# Patient Record
Sex: Female | Born: 1980 | Race: Black or African American | Hispanic: No | Smoking: Current every day smoker
Health system: Southern US, Community
[De-identification: ages and names within clinical notes are randomized; demographics above are authoritative.]

---

## 2016-01-09 ENCOUNTER — Emergency Department: Payer: Self-pay

## 2016-01-09 ENCOUNTER — Emergency Department
Admission: EM | Admit: 2016-01-09 | Discharge: 2016-01-09 | Disposition: A | Discharge status: Home / Self Care | Payer: Self-pay | Attending: Emergency Medicine | Admitting: Emergency Medicine

## 2016-01-09 DIAGNOSIS — R0789 Other chest pain: Secondary | ICD-10-CM | POA: Insufficient documentation

## 2016-01-09 DIAGNOSIS — R6889 Other general symptoms and signs: Secondary | ICD-10-CM

## 2016-01-09 DIAGNOSIS — J029 Acute pharyngitis, unspecified: Secondary | ICD-10-CM | POA: Insufficient documentation

## 2016-01-09 DIAGNOSIS — R5383 Other fatigue: Secondary | ICD-10-CM | POA: Insufficient documentation

## 2016-01-09 DIAGNOSIS — G43809 Other migraine, not intractable, without status migrainosus: Secondary | ICD-10-CM | POA: Insufficient documentation

## 2016-01-09 DIAGNOSIS — F17200 Nicotine dependence, unspecified, uncomplicated: Secondary | ICD-10-CM | POA: Insufficient documentation

## 2016-01-09 LAB — URINALYSIS COMPLETE WITH MICROSCOPIC (ARMC ONLY)
BACTERIA UA: NONE SEEN
Bilirubin Urine: NEGATIVE
GLUCOSE, UA: NEGATIVE mg/dL
Ketones, ur: NEGATIVE mg/dL
Leukocytes, UA: NEGATIVE
Nitrite: NEGATIVE
PROTEIN: NEGATIVE mg/dL
Specific Gravity, Urine: 1.016 (ref 1.005–1.030)
pH: 6 (ref 5.0–8.0)

## 2016-01-09 LAB — CBC
HEMATOCRIT: 35.7 % (ref 35.0–47.0)
HEMOGLOBIN: 12.8 g/dL (ref 12.0–16.0)
MCH: 33.8 pg (ref 26.0–34.0)
MCHC: 35.8 g/dL (ref 32.0–36.0)
MCV: 94.5 fL (ref 80.0–100.0)
Platelets: 351 10*3/uL (ref 150–440)
RBC: 3.78 MIL/uL — ABNORMAL LOW (ref 3.80–5.20)
RDW: 13.1 % (ref 11.5–14.5)
WBC: 7.1 10*3/uL (ref 3.6–11.0)

## 2016-01-09 LAB — BASIC METABOLIC PANEL
ANION GAP: 7 (ref 5–15)
BUN: 12 mg/dL (ref 6–20)
CO2: 23 mmol/L (ref 22–32)
Calcium: 8.9 mg/dL (ref 8.9–10.3)
Chloride: 106 mmol/L (ref 101–111)
Creatinine, Ser: 0.79 mg/dL (ref 0.44–1.00)
GFR calc Af Amer: >60 mL/min (ref >60)
GLUCOSE: 106 mg/dL — AB (ref 65–99)
POTASSIUM: 4 mmol/L (ref 3.5–5.1)
Sodium: 136 mmol/L (ref 135–145)

## 2016-01-09 LAB — POCT PREGNANCY, URINE: PREG TEST UR: NEGATIVE

## 2016-01-09 LAB — TROPONIN I: Troponin I: <0.03 ng/mL (ref <0.03)

## 2016-01-09 MED ORDER — KETOROLAC TROMETHAMINE 30 MG/ML IJ SOLN
30.0000 mg | Freq: Once | INTRAMUSCULAR | Status: AC
  Administered 2016-01-09: 30 mg via INTRAVENOUS
  Filled 2016-01-09: qty 1

## 2016-01-09 MED ORDER — SODIUM CHLORIDE 0.9 % IV BOLUS (SEPSIS)
1000.0000 mL | Freq: Once | INTRAVENOUS | Status: AC
  Administered 2016-01-09: 1000 mL via INTRAVENOUS

## 2016-01-09 MED ORDER — METOCLOPRAMIDE HCL 5 MG/ML IJ SOLN
10.0000 mg | Freq: Once | INTRAMUSCULAR | Status: AC
  Administered 2016-01-09: 10 mg via INTRAVENOUS
  Filled 2016-01-09: qty 2

## 2016-01-09 NOTE — Discharge Instructions (Signed)
Try to rest for the next 48 hours. Take ibuprofen 600 mg every 6 hours for body aches and headache. Drink plenty of fluids. Follow-up with her doctor on Monday for not feeling better. Return to the emergency room if you have a fever, abdominal pain, difficulty breathing, or any other symptoms that are concerning to you.

## 2016-01-09 NOTE — ED Provider Notes (Signed)
[facility] Emergency Department Provider Note  ____________________________________________  Time seen: Approximately 1:40 PM  I have reviewed the triage vital signs and the nursing notes.   HISTORY  Chief Complaint Chest Pain (chest tightness); Generalized Body Aches; and Migraine (headache)   HPI Leslie Holloway is a 35 y.o. female no significant past medical history who presents for evaluation of headache, body aches, chills, and sore throat. Patient reports that her symptoms have been going on for 2 days. She also reports a dry cough. Patient has a history of migraines and reports that this feels like one of her migraines, it is throbbing, diffuse, 8/10, associated with photophobia. She denies fever, tick bites, rash, neck stiffness. She reports that she had a sore throat yesterday however that getting better. She also reports body aches and chills, fatigue. She reports nausea and a few episodes of nonbloody nonbilious emesis. Today she reports that she started having sharp intermittent chest pain. She reports that the chest pain is located in the left side of her chest, sharp, lasting a few minutes at a time, associated with numbness of her both arms. She has had a few episodes throughout the day today. She denies shortness of breath, lightheadedness or syncope. No known sick contacts. Also reports dry cough.   History reviewed. No pertinent past medical history.  There are no active problems to display for this patient.   History reviewed. No pertinent surgical history.  Prior to Admission medications   Not on File    Allergies Darvon [propoxyphene]  No family history on file.  Social History Social History  Substance Use Topics  . Smoking status: Current Every Day Smoker  . Smokeless tobacco: Not on file  . Alcohol use Yes    Review of Systems  Constitutional: Negative for fever. + chills and fatigue, body aches Eyes: Negative for visual  changes. ENT: + sore throat. Cardiovascular: + chest pain. Respiratory: Negative for shortness of breath. + cough Gastrointestinal: Negative for abdominal pain, vomiting or diarrhea. Genitourinary: Negative for dysuria. Musculoskeletal: Negative for back pain. Skin: Negative for rash. Neurological: Negative for weakness or numbness. + HA  ____________________________________________   PHYSICAL EXAM:  VITAL SIGNS: ED Triage Vitals [01/09/16 1248]  Enc Vitals Group     BP (!) 158/83     Pulse Rate 75     Resp 18     Temp 98.2 F (36.8 C)     Temp Source Oral     SpO2 98 %     Weight 178 lb (80.7 kg)     Height 5\' 8"  (1.727 m)     Head Circumference      Peak Flow      Pain Score 10     Pain Loc      Pain Edu?      Excl. in GC?     Constitutional: Alert and oriented. Well appearing and in no apparent distress. HEENT:      Head: Normocephalic and atraumatic.         Eyes: Conjunctivae are normal. Sclera is non-icteric. EOMI. PERRL      Mouth/Throat: Mucous membranes are moist. Oropharynx is clear with no erythema, no exudate, no evidence of peritonsillar abscess      Neck: Supple with no signs of meningismus. No cervical lymphadenopathy Cardiovascular: Regular rate and rhythm. No murmurs, gallops, or rubs. 2+ symmetrical distal pulses are present in all extremities. No JVD. Respiratory: Normal respiratory effort. Lungs are clear to auscultation bilaterally.  No wheezes, crackles, or rhonchi.  Gastrointestinal: Soft, non tender, and non distended with positive bowel sounds. No rebound or guarding. Genitourinary: No CVA tenderness. Musculoskeletal: Nontender with normal range of motion in all extremities. No edema, cyanosis, or erythema of extremities. Neurologic: Normal speech and language. Face is symmetric. Moving all extremities. No gross focal neurologic deficits are appreciated. Skin: Skin is warm, dry and intact. No rash noted. Psychiatric: Mood and affect are normal.  Speech and behavior are normal.  ____________________________________________   LABS (all labs ordered are listed, but only abnormal results are displayed)  Labs Reviewed  BASIC METABOLIC PANEL - Abnormal; Notable for the following:       Result Value   Glucose, Bld 106 (*)    All other components within normal limits  CBC - Abnormal; Notable for the following:    RBC 3.78 (*)    All other components within normal limits  URINALYSIS COMPLETEWITH MICROSCOPIC ([facility] ONLY) - Abnormal; Notable for the following:    Color, Urine YELLOW (*)    APPearance HAZY (*)    Hgb urine dipstick 2+ (*)    Squamous Epithelial / LPF 6-30 (*)    All other components within normal limits  TROPONIN I  POCT PREGNANCY, URINE   ____________________________________________  EKG  ED ECG REPORT I, Nita Sickle, the attending physician, personally viewed and interpreted this ECG. Normal sinus rhythm, rate of 62, normal intervals, normal axis, no ST elevations or depressions  ____________________________________________  RADIOLOGY  CXR: negative  ____________________________________________   PROCEDURES  Procedure(s) performed: None Procedures Critical Care performed:  None ____________________________________________   INITIAL IMPRESSION / ASSESSMENT AND PLAN / ED COURSE  35 y.o. female no significant past medical history who presents for evaluation of headache, body aches, chills, fatigue, nausea, vomiting, and sore throat. Patient is well-appearing, in no distress, her vital signs are within normal limits, EKG is within normal limits, chest x-ray negative for pneumonia. Oropharynx is clear with no exudate, no rash, no cervical lymphadenopathy, no meningismus, neurologically intact. Presentation concerning for viral syndrome. Labs within normal limits. We'll treat symptoms with IV fluids, IV Toradol, IV Reglan and reassess.  Clinical Course  Comment By Time  Patient reports she feels  much better. HA resolved.  Her labs are all within normal limits. Chest x-ray negative for pneumonia. Presentation concerning for possible viral process. We'll discharge home with supportive care follow-up with primary care doctor. Patient and husband are comfortable with the plan. Nita Sickle, MD 08/26 1526    Pertinent labs & imaging results that were available during my care of the patient were reviewed by me and considered in my medical decision making (see chart for details).    ____________________________________________   FINAL CLINICAL IMPRESSION(S) / ED DIAGNOSES  Final diagnoses:  Other migraine without status migrainosus, not intractable  Flu-like symptoms      NEW MEDICATIONS STARTED DURING THIS VISIT:  New Prescriptions   No medications on file     Note:  This document was prepared using Dragon voice recognition software and may include unintentional dictation errors.    Nita Sickle, MD 01/09/16 [phone]

## 2016-01-09 NOTE — ED Notes (Signed)
Signature pad not working.  Pt verbalized understanding of discharge instructions and has no further questions. 

## 2016-01-09 NOTE — ED Notes (Signed)
Patient transported to X-ray 

## 2016-01-09 NOTE — ED Triage Notes (Addendum)
Pt arrives to ER via POV c/o chest tightness and headache that began this morning, body aches X 2 days. Pt tearful. Pt alert and oriented X4, active, cooperative, pt in NAD. RR even and unlabored, color WNL.

## 2016-02-15 ENCOUNTER — Encounter: Payer: Self-pay | Admitting: Emergency Medicine

## 2016-02-15 ENCOUNTER — Emergency Department: Payer: Self-pay

## 2016-02-15 ENCOUNTER — Emergency Department
Admission: EM | Admit: 2016-02-15 | Discharge: 2016-02-15 | Disposition: A | Discharge status: Home / Self Care | Payer: Self-pay | Attending: Emergency Medicine | Admitting: Emergency Medicine

## 2016-02-15 DIAGNOSIS — L03317 Cellulitis of buttock: Secondary | ICD-10-CM

## 2016-02-15 DIAGNOSIS — Z79899 Other long term (current) drug therapy: Secondary | ICD-10-CM | POA: Insufficient documentation

## 2016-02-15 DIAGNOSIS — F17200 Nicotine dependence, unspecified, uncomplicated: Secondary | ICD-10-CM | POA: Insufficient documentation

## 2016-02-15 DIAGNOSIS — L0231 Cutaneous abscess of buttock: Secondary | ICD-10-CM | POA: Insufficient documentation

## 2016-02-15 LAB — CBC WITH DIFFERENTIAL/PLATELET
Basophils Absolute: 0.1 10*3/uL (ref 0–0.1)
Basophils Relative: 1 %
Eosinophils Absolute: 0.3 10*3/uL (ref 0–0.7)
Eosinophils Relative: 2 %
HEMATOCRIT: 36.5 % (ref 35.0–47.0)
HEMOGLOBIN: 12 g/dL (ref 12.0–16.0)
LYMPHS ABS: 2.6 10*3/uL (ref 1.0–3.6)
LYMPHS PCT: 20 %
MCH: 30.9 pg (ref 26.0–34.0)
MCHC: 32.8 g/dL (ref 32.0–36.0)
MCV: 94.3 fL (ref 80.0–100.0)
MONOS PCT: 8 %
Monocytes Absolute: 1 10*3/uL — ABNORMAL HIGH (ref 0.2–0.9)
NEUTROS PCT: 69 %
Neutro Abs: 8.7 10*3/uL — ABNORMAL HIGH (ref 1.4–6.5)
PLATELETS: 470 10*3/uL — AB (ref 150–440)
RBC: 3.87 MIL/uL (ref 3.80–5.20)
RDW: 13 % (ref 11.5–14.5)
WBC: 12.7 10*3/uL — AB (ref 3.6–11.0)

## 2016-02-15 LAB — COMPREHENSIVE METABOLIC PANEL
ALT: 15 U/L (ref 14–54)
AST: 19 U/L (ref 15–41)
Albumin: 3.7 g/dL (ref 3.5–5.0)
Alkaline Phosphatase: 53 U/L (ref 38–126)
Anion gap: 8 (ref 5–15)
BILIRUBIN TOTAL: 1.3 mg/dL — AB (ref 0.3–1.2)
BUN: 7 mg/dL (ref 6–20)
CHLORIDE: 101 mmol/L (ref 101–111)
CO2: 29 mmol/L (ref 22–32)
CREATININE: 0.79 mg/dL (ref 0.44–1.00)
Calcium: 8.9 mg/dL (ref 8.9–10.3)
Glucose, Bld: 115 mg/dL — ABNORMAL HIGH (ref 65–99)
POTASSIUM: 2.9 mmol/L — AB (ref 3.5–5.1)
Sodium: 138 mmol/L (ref 135–145)
TOTAL PROTEIN: 8.4 g/dL — AB (ref 6.5–8.1)

## 2016-02-15 LAB — URINALYSIS COMPLETE WITH MICROSCOPIC (ARMC ONLY)
BACTERIA UA: NONE SEEN
BILIRUBIN URINE: NEGATIVE
GLUCOSE, UA: NEGATIVE mg/dL
Ketones, ur: NEGATIVE mg/dL
LEUKOCYTES UA: NEGATIVE
Nitrite: NEGATIVE
Protein, ur: NEGATIVE mg/dL
Specific Gravity, Urine: 1.01 (ref 1.005–1.030)
pH: 7 (ref 5.0–8.0)

## 2016-02-15 LAB — POCT PREGNANCY, URINE: Preg Test, Ur: NEGATIVE

## 2016-02-15 LAB — TROPONIN I

## 2016-02-15 LAB — LIPASE, BLOOD: LIPASE: 22 U/L (ref 11–51)

## 2016-02-15 MED ORDER — PIPERACILLIN-TAZOBACTAM 3.375 G IVPB 30 MIN
3.3750 g | Freq: Once | INTRAVENOUS | Status: AC
  Administered 2016-02-15: 3.375 g via INTRAVENOUS
  Filled 2016-02-15: qty 50

## 2016-02-15 MED ORDER — LIDOCAINE-EPINEPHRINE 1 %-1:200000 IJ SOLN
30.0000 mL | Freq: Once | INTRAMUSCULAR | Status: AC
Start: 2016-02-15 — End: 2016-02-15
  Administered 2016-02-15: 30 mL
  Filled 2016-02-15: qty 30

## 2016-02-15 MED ORDER — IOPAMIDOL (ISOVUE-300) INJECTION 61%
15.0000 mL | INTRAVENOUS | Status: AC
  Administered 2016-02-15: 30 mL via ORAL
  Administered 2016-02-15: 15 mL via ORAL

## 2016-02-15 MED ORDER — ONDANSETRON HCL 4 MG/2ML IJ SOLN
4.0000 mg | Freq: Once | INTRAMUSCULAR | Status: AC
  Administered 2016-02-15: 4 mg via INTRAVENOUS
  Filled 2016-02-15: qty 2

## 2016-02-15 MED ORDER — MORPHINE SULFATE (PF) 4 MG/ML IV SOLN
4.0000 mg | Freq: Once | INTRAVENOUS | Status: AC
Start: 2016-02-15 — End: 2016-02-15
  Administered 2016-02-15: 4 mg via INTRAVENOUS
  Filled 2016-02-15: qty 1

## 2016-02-15 MED ORDER — SODIUM CHLORIDE 0.9 % IV BOLUS (SEPSIS)
1000.0000 mL | Freq: Once | INTRAVENOUS | Status: AC
Start: 2016-02-15 — End: 2016-02-15
  Administered 2016-02-15: 1000 mL via INTRAVENOUS

## 2016-02-15 MED ORDER — POTASSIUM CHLORIDE CRYS ER 20 MEQ PO TBCR
40.0000 meq | EXTENDED_RELEASE_TABLET | Freq: Once | ORAL | Status: AC
  Administered 2016-02-15: 40 meq via ORAL
  Filled 2016-02-15: qty 2

## 2016-02-15 MED ORDER — CLINDAMYCIN HCL 300 MG PO CAPS: 300.0000 mg | ORAL_CAPSULE | Freq: Four times a day (QID) | ORAL | 0 refills | Status: AC

## 2016-02-15 MED ORDER — IOPAMIDOL (ISOVUE-300) INJECTION 61%
100.0000 mL | Freq: Once | INTRAVENOUS | Status: AC | PRN
  Administered 2016-02-15: 100 mL via INTRAVENOUS

## 2016-02-15 MED ORDER — OXYCODONE HCL 5 MG PO TABS: 5.0000 mg | ORAL_TABLET | Freq: Four times a day (QID) | ORAL | 0 refills | Status: AC | PRN

## 2016-02-15 MED ORDER — CLINDAMYCIN HCL 150 MG PO CAPS
300.0000 mg | ORAL_CAPSULE | Freq: Once | ORAL | Status: AC
  Administered 2016-02-15: 300 mg via ORAL
  Filled 2016-02-15: qty 2

## 2016-02-15 NOTE — ED Triage Notes (Signed)
Patient states she noticed on Thursday when she felt the area in the shower.  Started hurting Friday PM.  Using antibacterial soap, triple antibiotic, no improvement.  Has red raised area inner buttocks.  Patient states she also has sharp tingling pain like sciatic nerve pain.  Also complaining of headache and "passing out".  States she does not feel well.

## 2016-02-15 NOTE — ED Provider Notes (Signed)
[facility]   ____________________________________________   None    (approximate)  I have reviewed the triage vital signs and the nursing notes.   HISTORY  Chief Complaint Rectal Pain    HPI Leslie Holloway is a 35 y.o. female with no chronic medical problems who presents for evaluation of 4 days of swelling at the natal cleft, gradual onset, constant, severe, worse when she attempts to sit or lie on it. She reports that she is also had fevers and chills. No nausea, vomiting or diarrhea. No chest pain or difficulty breathing. She reports that she was told by her significant other that while she was in the car yesterday she might have "passed out" but she does not recall this. She has had multiple syncopal episodes in the past.   History reviewed. No pertinent past medical history.  There are no active problems to display for this patient.   History reviewed. No pertinent surgical history.  Prior to Admission medications   Medication Sig Start Date End Date Taking? Authorizing Provider  clonazePAM (KLONOPIN) 0.5 MG tablet Take 0.5 mg by mouth as needed for anxiety.   Yes Historical Provider, MD    Allergies Darvon [propoxyphene]  No family history on file.  Social History Social History  Substance Use Topics  . Smoking status: Current Every Day Smoker  . Smokeless tobacco: Never Used  . Alcohol use Yes    Review of Systems Constitutional: + fever/chills Eyes: No visual changes. ENT: No sore throat. Cardiovascular: Denies chest pain. Respiratory: Denies shortness of breath. Gastrointestinal: No abdominal pain.  No nausea, no vomiting.  No diarrhea.  No constipation. Genitourinary: Negative for dysuria. Musculoskeletal: Negative for back pain. Skin: Negative for rash. Neurological: Negative for headaches, focal weakness or numbness.  10-point ROS otherwise  negative.  ____________________________________________   PHYSICAL EXAM:  Vitals:   02/15/16 0907 02/15/16 1510  BP: (!) 151/93 133/81  Pulse: 97 96  Resp: 18 18  Temp: 97.6 F (36.4 C)   TempSrc: Oral   SpO2: 100% 100%  Weight: 180 lb (81.6 kg)   Height: 5\' 8"  (1.727 m)     VITAL SIGNS: ED Triage Vitals  Enc Vitals Group     BP 02/15/16 0907 (!) 151/93     Pulse Rate 02/15/16 0907 97     Resp 02/15/16 0907 18     Temp 02/15/16 0907 97.6 F (36.4 C)     Temp Source 02/15/16 0907 Oral     SpO2 02/15/16 0907 100 %     Weight 02/15/16 0907 180 lb (81.6 kg)     Height 02/15/16 0907 5\' 8"  (1.727 m)     Head Circumference --      Peak Flow --      Pain Score 02/15/16 0926 10     Pain Loc --      Pain Edu? --      Excl. in GC? --     Constitutional: Alert and oriented. Nontoxic-appearing and in NAD at rest. Eyes: Conjunctivae are normal. PERRL. EOMI. Head: Atraumatic. Nose: No congestion/rhinnorhea. Mouth/Throat: Mucous membranes are moist.  Oropharynx non-erythematous. Neck: No stridor.  Cardiovascular: Normal rate, regular rhythm. Grossly normal heart sounds.  Good peripheral circulation. Respiratory: Normal respiratory effort.  No retractions. Lungs CTAB. Gastrointestinal: Soft and nontender. No distention.  No CVA tenderness. Genitourinary: deferred Musculoskeletal: No lower extremity tenderness nor edema.  No joint effusions. Neurologic:  Normal speech and language. No gross focal neurologic  deficits are appreciated. Skin:  Skin is warm, dry, intact. There is a large area of tender erythematous fluctuance at the top of the natal cleft totaling 3 cm in the left buttocks, transversing the cleft and extending into the right buttocks 2-3 cm. The rectum is several centimeters inferior to the lesion. Psychiatric: Mood and affect are normal. Speech and behavior are normal.  ____________________________________________   LABS (all labs ordered are listed, but only  abnormal results are displayed)  Labs Reviewed  CBC WITH DIFFERENTIAL/PLATELET - Abnormal; Notable for the following:       Result Value   WBC 12.7 (*)    Platelets 470 (*)    Neutro Abs 8.7 (*)    Monocytes Absolute 1.0 (*)    All other components within normal limits  COMPREHENSIVE METABOLIC PANEL - Abnormal; Notable for the following:    Potassium 2.9 (*)    Glucose, Bld 115 (*)    Total Protein 8.4 (*)    Total Bilirubin 1.3 (*)    All other components within normal limits  URINALYSIS COMPLETEWITH MICROSCOPIC ([facility] ONLY) - Abnormal; Notable for the following:    Color, Urine YELLOW (*)    APPearance CLEAR (*)    Hgb urine dipstick 2+ (*)    Squamous Epithelial / LPF 0-5 (*)    All other components within normal limits  CULTURE, BLOOD (ROUTINE X 2)  CULTURE, BLOOD (ROUTINE X 2)  LIPASE, BLOOD  TROPONIN I  POC URINE PREG, ED  POCT PREGNANCY, URINE   ____________________________________________  EKG  ED ECG REPORT I, Gayla Doss, the attending physician, personally viewed and interpreted this ECG.   Date: 02/15/2016  EKG Time: 12:35  Rate: 90  Rhythm: normal sinus rhythm  Axis: normal  Intervals:none  ST&T Change: No acute ST elevation or acute ST depression.  ____________________________________________  RADIOLOGY  CT pelvis IMPRESSION:  2 x 2.4 cm abscess within the superficial subcutaneous tissues  overlying the level of the coccyx.     CXR IMPRESSION:  No active cardiopulmonary disease.      ____________________________________________   PROCEDURES  Procedure(s) performed:   INCISION AND DRAINAGE Performed by: Toney Rakes A Consent: Verbal consent obtained. Risks and benefits: risks, benefits and alternatives were discussed Type: abscess  Body area: natal cleft, over the coccyx  Anesthesia: local infiltration  Incision was made with a scalpel.  Local anesthetic: lidocaine 1% with epinephrine  Anesthetic total: 5  ml  Complexity: complex Blunt dissection to break up loculations  Drainage: purulent  Drainage amount: 3 cc  Packing material: 1/4 in iodoform gauze  Patient tolerance: Patient tolerated the procedure well with no immediate complications.    Procedures  Critical Care performed: No  ____________________________________________   INITIAL IMPRESSION / ASSESSMENT AND PLAN / ED COURSE  Pertinent labs & imaging results that were available during my care of the patient were reviewed by me and considered in my medical decision making (see chart for details).  Leslie Holloway is a 35 y.o. female with no chronic medical problems who presents for evaluation of 4 days of swelling at the natal cleft, gradual onset, constant, severe, worse when she attempts to sit or lie on it. On exam, she is nontoxic appearing and in no acute distress. Her vital signs are stable she is afebrile. She has what appears to be abscess with cellulitis involving both sides of the buttocks and transversing the natal cleft, the area is exquisitely tender to palpation, I've ordered a CT scan to  evaluate for any rectal involvement as I am not sure how deep it tracks. White blood cell count is mildly elevated and given her complaint of fever at home, I've ordered IV Zosyn. She had this questionable episode of syncope yesterday. her EKG is reassuring, we'll obtain screening labs as well as urinalysis and chest x-ray. Reassess for disposition. We'll treat her pain and give IV fluids.  ----------------------------------------- 3:47 PM on 02/15/2016 ----------------------------------------- Patient continues to appear well, vital signs stable. I reviewed her labs. CBC with mild leukocytosis, also mild thrombocytosis with a platelet count of 470 to be reactive in the setting of infection. She is not meeting septic criteria. CMP is generally unremarkable with the exception of mild hypokalemia, the patient received potassium  supplementation here and will need to see a primary care doctor for recheck. Negative troponin, normal chest x-ray, she has been observed on the cardiac monitor for several hours without any witnessed clinically significant arrhythmia and if she did have an episode of syncope yesterday, I doubt that it was purely cardiogenic or neurogenic. It was likely vasovagal given history of same. CT scan showed a superficial subcutaneous abscess over the coccyx which I have drained and the patient tolerated this well. We'll discharge with clindamycin, Roxicodone. She has been instructed to follow-up here in 48 hours for wound recheck. We discussed return precautions and she is comfortable with the discharge plan. DC home.   Clinical Course     ____________________________________________   FINAL CLINICAL IMPRESSION(S) / ED DIAGNOSES  Final diagnoses:  Cellulitis and abscess of buttock      NEW MEDICATIONS STARTED DURING THIS VISIT:  New Prescriptions   No medications on file     Note:  This document was prepared using Dragon voice recognition software and may include unintentional dictation errors.    Gayla Doss, MD 02/15/16 [phone]

## 2016-02-15 NOTE — ED Notes (Signed)
Cover dressing placed to I&D site.

## 2016-02-15 NOTE — ED Triage Notes (Signed)
Patient states she was told that she passed out.  Denies LOC.

## 2016-02-15 NOTE — ED Notes (Signed)

## 2016-02-15 NOTE — ED Notes (Addendum)
Pt has red raised area between cheeks of buttocks - area has been present since Friday - pt reports that she has had a "white bump" there for years - area is not draining or bleeding just painful to sit/stand/walk due to pressure on the area Pt reports that yesterday she had a hard time breathing and then passed out - fever 99.6 yesterda

## 2016-02-17 ENCOUNTER — Encounter: Payer: Self-pay | Admitting: Emergency Medicine

## 2016-02-17 ENCOUNTER — Emergency Department
Admission: EM | Admit: 2016-02-17 | Discharge: 2016-02-17 | Disposition: A | Discharge status: Home / Self Care | Payer: Self-pay | Attending: Emergency Medicine | Admitting: Emergency Medicine

## 2016-02-17 DIAGNOSIS — Z4801 Encounter for change or removal of surgical wound dressing: Secondary | ICD-10-CM | POA: Insufficient documentation

## 2016-02-17 DIAGNOSIS — F17200 Nicotine dependence, unspecified, uncomplicated: Secondary | ICD-10-CM | POA: Insufficient documentation

## 2016-02-17 DIAGNOSIS — F1290 Cannabis use, unspecified, uncomplicated: Secondary | ICD-10-CM | POA: Insufficient documentation

## 2016-02-17 DIAGNOSIS — Z5189 Encounter for other specified aftercare: Secondary | ICD-10-CM

## 2016-02-17 NOTE — ED Triage Notes (Signed)
Pt here for wound check.

## 2016-02-17 NOTE — ED Notes (Signed)
Pt returns for a wound recheck of an abscess on buttocks, pt reports drainage/bloody from wound

## 2016-02-17 NOTE — ED Provider Notes (Signed)
[facility] Emergency Department Provider Note   ____________________________________________   First MD Initiated Contact with Patient 02/17/16 1002     (approximate)  I have reviewed the triage vital signs and the nursing notes.   HISTORY  Chief Complaint Wound Check    HPI Leslie Holloway is a 35 y.o. female patient here for wound check secondary to an abscess that was incised and drained yesterday. Patient states mild pain isn't controlled with ibuprofen. Patient states she might have a mild fever yesterday but no fever today. Patient rates the pain as a 3/10. Patient described a pain as achy and occasionally sharp.   History reviewed. No pertinent past medical history.  There are no active problems to display for this patient.   History reviewed. No pertinent surgical history.  Prior to Admission medications   Medication Sig Start Date End Date Taking? Authorizing Provider  clindamycin (CLEOCIN) 300 MG capsule Take 1 capsule (300 mg total) by mouth 4 (four) times daily. 02/15/16 02/22/16  Gayla DossEryka A Gayle, MD  clonazePAM (KLONOPIN) 0.5 MG tablet Take 0.5 mg by mouth as needed for anxiety.    Historical Provider, MD  oxyCODONE (ROXICODONE) 5 MG immediate release tablet Take 1 tablet (5 mg total) by mouth every 6 (six) hours as needed for moderate pain. Do not drive while taking this medication. 02/15/16   Gayla DossEryka A Gayle, MD    Allergies Darvon [propoxyphene]  No family history on file.  Social History Social History  Substance Use Topics  . Smoking status: Current Every Day Smoker  . Smokeless tobacco: Never Used  . Alcohol use Yes    Review of Systems Constitutional: No fever/chills Eyes: No visual changes. ENT: No sore throat. Cardiovascular: Denies chest pain. Respiratory: Denies shortness of breath. Gastrointestinal: No abdominal pain.  No nausea, no vomiting.  No diarrhea.  No constipation. Genitourinary: Negative for  dysuria. Musculoskeletal: Negative for back pain. Skin: Negative for rash.Abscess between the buttocks Neurological: Negative for headaches, focal weakness or numbness. .  ____________________________________________   PHYSICAL EXAM:  VITAL SIGNS: ED Triage Vitals  Enc Vitals Group     BP 02/17/16 0947 (!) 145/83     Pulse Rate 02/17/16 0947 73     Resp 02/17/16 0947 20     Temp --      Temp src --      SpO2 02/17/16 0947 98 %     Weight 02/17/16 0943 180 lb (81.6 kg)     Height --      Head Circumference --      Peak Flow --      Pain Score --      Pain Loc --      Pain Edu? --      Excl. in GC? --     Constitutional: Alert and oriented. Well appearing and in no acute distress. Eyes: Conjunctivae are normal. PERRL. EOMI. Head: Atraumatic. Nose: No congestion/rhinnorhea. Mouth/Throat: Mucous membranes are moist.  Oropharynx non-erythematous. Neck: No stridor.  No cervical spine tenderness to palpation. Hematological/Lymphatic/Immunilogical: No cervical lymphadenopathy. Cardiovascular: Normal rate, regular rhythm. Grossly normal heart sounds.  Good peripheral circulation. Respiratory: Normal respiratory effort.  No retractions. Lungs CTAB. Gastrointestinal: Soft and nontender. No distention. No abdominal bruits. No CVA tenderness. Musculoskeletal: No lower extremity tenderness nor edema.  No joint effusions. Neurologic:  Normal speech and language. No gross focal neurologic deficits are appreciated. No gait instability. Skin:  Skin is warm, dry and intact. No rash noted. Inside areas  tender erythematous. The packing material was removed from wound and irrigation revealed clear return. Psychiatric: Mood and affect are normal. Speech and behavior are normal.  ____________________________________________   LABS (all labs ordered are listed, but only abnormal results are displayed)  Labs Reviewed - No data to  display ____________________________________________  EKG   ____________________________________________  RADIOLOGY   ____________________________________________   PROCEDURES  Procedure(s) performed: None  Procedures  Critical Care performed: No  ____________________________________________   INITIAL IMPRESSION / ASSESSMENT AND PLAN / ED COURSE  Pertinent labs & imaging results that were available during my care of the patient were reviewed by me and considered in my medical decision making (see chart for details).  Abscess wound check. Patient given discharge Instructions. Patient advised to continue  antibiotics and pain medication as needed. Patient advised she may return to work tomorrow. Return by ER for condition worsens.  Clinical Course     ____________________________________________   FINAL CLINICAL IMPRESSION(S) / ED DIAGNOSES  Final diagnoses:  Wound check, abscess      NEW MEDICATIONS STARTED DURING THIS VISIT:  Discharge Medication List as of 02/17/2016 10:10 AM       Note:  This document was prepared using Dragon voice recognition software and may include unintentional dictation errors.    Joni Reining, PA-C 02/17/16 1016    Sharman Cheek, MD 02/17/16 [phone]

## 2016-02-20 LAB — CULTURE, BLOOD (ROUTINE X 2)
Culture: NO GROWTH
Culture: NO GROWTH

## 2016-03-02 ENCOUNTER — Emergency Department
Admission: EM | Admit: 2016-03-02 | Discharge: 2016-03-02 | Disposition: A | Discharge status: Home / Self Care | Payer: Self-pay | Attending: Emergency Medicine | Admitting: Emergency Medicine

## 2016-03-02 ENCOUNTER — Encounter: Payer: Self-pay | Admitting: Emergency Medicine

## 2016-03-02 DIAGNOSIS — F17200 Nicotine dependence, unspecified, uncomplicated: Secondary | ICD-10-CM | POA: Insufficient documentation

## 2016-03-02 DIAGNOSIS — N750 Cyst of Bartholin's gland: Secondary | ICD-10-CM | POA: Insufficient documentation

## 2016-03-02 MED ORDER — SULFAMETHOXAZOLE-TRIMETHOPRIM 800-160 MG PO TABS: 1.0000 | ORAL_TABLET | Freq: Two times a day (BID) | ORAL | 0 refills | Status: AC

## 2016-03-02 MED ORDER — HYDROCODONE-ACETAMINOPHEN 5-325 MG PO TABS
ORAL_TABLET | ORAL | Status: AC
  Filled 2016-03-02: qty 1

## 2016-03-02 MED ORDER — HYDROCODONE-ACETAMINOPHEN 5-325 MG PO TABS
1.0000 | ORAL_TABLET | Freq: Once | ORAL | Status: AC
  Administered 2016-03-02: 1 via ORAL

## 2016-03-02 MED ORDER — SULFAMETHOXAZOLE-TRIMETHOPRIM 800-160 MG PO TABS
1.0000 | ORAL_TABLET | Freq: Once | ORAL | Status: AC
  Administered 2016-03-02: 1 via ORAL
  Filled 2016-03-02: qty 1

## 2016-03-02 MED ORDER — HYDROCODONE-ACETAMINOPHEN 5-325 MG PO TABS: 1.0000 | ORAL_TABLET | ORAL | 0 refills | Status: AC | PRN

## 2016-03-02 NOTE — ED Notes (Signed)
Pt reports raised area to the genital region x1 day. Pt c/o of pain and swelling. Denies drainage.

## 2016-03-02 NOTE — ED Provider Notes (Signed)
[facility]-EMERGENCY DEPARTMENT Provider Note   CSN: [id number] Arrival date & time: 03/02/16  1936     History   Chief Complaint Chief Complaint  Patient presents with  . Abscess    HPI Leslie Holloway is a 35 y.o. female presents to the emergency department for evaluation of vaginal pain. Patient states she's had swelling to the right vaginal area that began earlier today. She denies any fevers. Pain is moderate and increased to touch. She denies any drainage. She has not taken any medications for pain. She has not had any recent infections. She denies any trauma or injury.  HPI  History reviewed. No pertinent past medical history.  There are no active problems to display for this patient.   History reviewed. No pertinent surgical history.  OB History    Gravida Para Term Preterm AB Living   1             SAB TAB Ectopic Multiple Live Births                   Home Medications    Prior to Admission medications   Medication Sig Start Date End Date Taking? Authorizing Provider  clonazePAM (KLONOPIN) 0.5 MG tablet Take 0.5 mg by mouth as needed for anxiety.    Historical Provider, MD  HYDROcodone-acetaminophen (NORCO) 5-325 MG tablet Take 1 tablet by mouth every 4 (four) hours as needed for moderate pain. 03/02/16   Evon Slackhomas C Kamaiyah Uselton, PA-C  oxyCODONE (ROXICODONE) 5 MG immediate release tablet Take 1 tablet (5 mg total) by mouth every 6 (six) hours as needed for moderate pain. Do not drive while taking this medication. 02/15/16   Gayla DossEryka A Gayle, MD  sulfamethoxazole-trimethoprim (BACTRIM DS,SEPTRA DS) 800-160 MG tablet Take 1 tablet by mouth 2 (two) times daily. 03/02/16   Evon Slackhomas C Brandon Wiechman, PA-C    Family History No family history on file.  Social History Social History  Substance Use Topics  . Smoking status: Current Every Day Smoker  . Smokeless tobacco: Never Used  . Alcohol use Yes     Allergies   Darvon [propoxyphene]   Review of Systems Review of Systems    Constitutional: Negative for activity change, chills, fatigue and fever.  HENT: Negative for congestion, sinus pressure and sore throat.   Eyes: Negative for visual disturbance.  Respiratory: Negative for cough, chest tightness and shortness of breath.   Cardiovascular: Negative for chest pain and leg swelling.  Gastrointestinal: Negative for abdominal pain, diarrhea, nausea and vomiting.  Genitourinary: Negative for dysuria.  Musculoskeletal: Negative for arthralgias and gait problem.  Skin: Positive for wound. Negative for rash.  Neurological: Negative for weakness, numbness and headaches.  Hematological: Negative for adenopathy.  Psychiatric/Behavioral: Negative for agitation, behavioral problems and confusion.     Physical Exam Updated Vital Signs BP (!) 142/77 (BP Location: Left Arm)   Pulse (!) 103   Temp 98.8 F (37.1 C) (Oral)   Resp 18   Ht 5\' 8"  (1.727 m)   Wt 81.6 kg   LMP 02/14/2016 (Exact Date)   SpO2 100%   BMI 27.37 kg/m   Physical Exam  Constitutional: She appears well-developed and well-nourished. No distress.  HENT:  Head: Normocephalic and atraumatic.  Eyes: Conjunctivae are normal.  Neck: Normal range of motion. Neck supple.  Cardiovascular: Normal rate and regular rhythm.   No murmur heard. Pulmonary/Chest: Effort normal and breath sounds normal. No respiratory distress.  Abdominal: Soft. There is no tenderness.  Genitourinary: Vagina normal. No  vaginal discharge found.  Genitourinary Comments: On the vulvar visual examination there is no evidence of masses or soft tissue swelling. With deep palpation, there is a small 1 cm diameter tender nodule consistent with a Bartholin's cyst. There is no redness, warmth, fluctuance. There is tenderness to palpation, no signs of any drainage.  Musculoskeletal: She exhibits no edema.  Neurological: She is alert.  Skin: Skin is warm and dry.  Psychiatric: She has a normal mood and affect.  Nursing note and vitals  reviewed.    ED Treatments / Results  Labs (all labs ordered are listed, but only abnormal results are displayed) Labs Reviewed - No data to display  EKG  EKG Interpretation None       Radiology No results found.  Procedures Procedures (including critical care time)  Medications Ordered in ED Medications  HYDROcodone-acetaminophen (NORCO/VICODIN) 5-325 MG per tablet (not administered)  HYDROcodone-acetaminophen (NORCO/VICODIN) 5-325 MG per tablet 1 tablet (1 tablet Oral Given 03/02/16 2030)  sulfamethoxazole-trimethoprim (BACTRIM DS,SEPTRA DS) 800-160 MG per tablet 1 tablet (1 tablet Oral Given 03/02/16 2030)     Initial Impression / Assessment and Plan / ED Course  I have reviewed the triage vital signs and the nursing notes.  Pertinent labs & imaging results that were available during my care of the patient were reviewed by me and considered in my medical decision making (see chart for details).  Clinical Course  35 year old female with right vaginal cyst, probable Bartholin's cyst or early abscess. Cyst is very deep with no signs of fluctuance warmth or redness. Will start patient on oral antibiotic's Bactrim DS 1 tab by mouth twice a day. She is given Norco for pain. She will apply warm compresses and follow-up with GYN via telephone call tomorrow. Return to the ER for any worsening symptoms urgent changes in her health.   Final Clinical Impressions(s) / ED Diagnoses   Final diagnoses:  Bartholin's cyst    New Prescriptions New Prescriptions   HYDROCODONE-ACETAMINOPHEN (NORCO) 5-325 MG TABLET    Take 1 tablet by mouth every 4 (four) hours as needed for moderate pain.   SULFAMETHOXAZOLE-TRIMETHOPRIM (BACTRIM DS,SEPTRA DS) 800-160 MG TABLET    Take 1 tablet by mouth 2 (two) times daily.     Evon Slack, PA-C 03/02/16 2044    Nita Sickle, MD 03/03/16 [phone]

## 2016-03-02 NOTE — Discharge Instructions (Signed)
Please apply warm soaks to the area, take medications as prescribed. Follow-up with GYN physician. Return to the ER for any worsening symptoms urgent changes in her health.

## 2016-03-02 NOTE — ED Notes (Signed)
Accompanied PA Thayer OhmChris to assess abscess

## 2016-03-02 NOTE — ED Triage Notes (Signed)
Patient ambulatory to triage with steady gait, without difficulty or distress noted; pt reports abscess to right inner thigh today; seen here recently for separate incident

## 2016-03-04 ENCOUNTER — Encounter: Payer: Self-pay | Admitting: Emergency Medicine

## 2016-03-04 ENCOUNTER — Emergency Department
Admission: EM | Admit: 2016-03-04 | Discharge: 2016-03-04 | Disposition: A | Discharge status: Home / Self Care | Payer: Self-pay | Attending: Emergency Medicine | Admitting: Emergency Medicine

## 2016-03-04 DIAGNOSIS — Z79899 Other long term (current) drug therapy: Secondary | ICD-10-CM | POA: Insufficient documentation

## 2016-03-04 DIAGNOSIS — F1290 Cannabis use, unspecified, uncomplicated: Secondary | ICD-10-CM | POA: Insufficient documentation

## 2016-03-04 DIAGNOSIS — F17200 Nicotine dependence, unspecified, uncomplicated: Secondary | ICD-10-CM | POA: Insufficient documentation

## 2016-03-04 DIAGNOSIS — N750 Cyst of Bartholin's gland: Secondary | ICD-10-CM | POA: Insufficient documentation

## 2016-03-04 MED ORDER — HYDROCODONE-ACETAMINOPHEN 5-325 MG PO TABS: 1.0000 | ORAL_TABLET | Freq: Three times a day (TID) | ORAL | 0 refills | Status: AC | PRN

## 2016-03-04 MED ORDER — OXYCODONE-ACETAMINOPHEN 5-325 MG PO TABS
1.0000 | ORAL_TABLET | Freq: Once | ORAL | Status: AC
  Administered 2016-03-04: 1 via ORAL
  Filled 2016-03-04: qty 1

## 2016-03-04 NOTE — ED Notes (Signed)
See triage note   States she developed a possible abscess area to vagina couple of days ago  Swelling and pian has increased to right vaginal area this am

## 2016-03-04 NOTE — Discharge Instructions (Signed)
Take the previously prescribed antibiotic as directed. [facility]YN provider or [facility]. Apply warm compresses to reduce symptoms. Return to the ED for acutely worsening symptoms.

## 2016-03-04 NOTE — ED Provider Notes (Signed)
[facility] Emergency Department Provider Note ____________________________________________  Time seen: [phone]  I have reviewed the triage vital signs and the nursing notes.  HISTORY  Chief Complaint  Pelvic Pain  HPI Leslie Holloway is a 35 y.o. female presents to the ED for evaluationof her Bartholin gland cyst that she was initially screened for 2 days prior. Patient returns stating that the area appears to be slightly larger in size at this time she still denies any spontaneous drainage. She has been noncompliant with instructions to apply warm compresses or do sits baths. She has been taken it previously prescribed antibiotic and pain medicine as directed. She has a request for a different, "stronger" pain medicine at this time. She has failed to follow-up with the GYN provider as was requested. Eyes any interim fevers, chills, or sweats.  History reviewed. No pertinent past medical history.  There are no active problems to display for this patient.   History reviewed. No pertinent surgical history.  Prior to Admission medications   Medication Sig Start Date End Date Taking? Authorizing Provider  clonazePAM (KLONOPIN) 0.5 MG tablet Take 0.5 mg by mouth as needed for anxiety.    Historical Provider, MD  HYDROcodone-acetaminophen (NORCO) 5-325 MG tablet Take 1 tablet by mouth every 4 (four) hours as needed for moderate pain. 03/02/16   Evon Slack, PA-C  HYDROcodone-acetaminophen (NORCO) 5-325 MG tablet Take 1 tablet by mouth every 8 (eight) hours as needed. 03/04/16   Stephie Xu V Bacon Christ Fullenwider, PA-C  oxyCODONE (ROXICODONE) 5 MG immediate release tablet Take 1 tablet (5 mg total) by mouth every 6 (six) hours as needed for moderate pain. Do not drive while taking this medication. 02/15/16   Gayla Doss, MD  sulfamethoxazole-trimethoprim (BACTRIM DS,SEPTRA DS) 800-160 MG tablet Take 1 tablet by mouth 2 (two) times daily. 03/02/16   Evon Slack, PA-C     Allergies Darvon [propoxyphene]  History reviewed. No pertinent family history.  Social History Social History  Substance Use Topics  . Smoking status: Current Every Day Smoker  . Smokeless tobacco: Never Used  . Alcohol use Yes    Review of Systems  Constitutional: Negative for fever. Gastrointestinal: Negative for abdominal pain, vomiting and diarrhea. Genitourinary: Negative for dysuria.Vulvar cyst as above. Musculoskeletal: Negative for back pain. Skin: Negative for rash. Neurological: Negative for headaches, focal weakness or numbness. ____________________________________________  PHYSICAL EXAM:  VITAL SIGNS: ED Triage Vitals [03/04/16 0812]  Enc Vitals Group     BP      Pulse      Resp      Temp      Temp src      SpO2      Weight 180 lb (81.6 kg)     Height 5\' 8"  (1.727 m)     Head Circumference      Peak Flow      Pain Score 10     Pain Loc      Pain Edu?      Excl. in GC?    Constitutional: Alert and oriented. Well appearing and in no distress. Head: Normocephalic and atraumatic. Respiratory: Normal respiratory effort. GU: Normal external genitalia. Small, palpable, deep, cystic formation to the right labia about the size of a grape. No fluctuance, pointing, or spontaneous drainage noted.  Musculoskeletal: Nontender with normal range of motion in all extremities.  Neurologic:  Normal gait without ataxia. Normal speech and language. No gross focal neurologic deficits are appreciated. Skin:  Skin is warm,  dry and intact. No rash noted. ____________________________________________  PROCEDURES  Roxicet 5-325 mg PO ____________________________________________  INITIAL IMPRESSION / ASSESSMENT AND PLAN / ED COURSE  Patient with a stable right Bartholin gland cyst on follow-up wound check. She is to continue the previous prescribed Bactrim as recommended. She is also advised to begin warm compresses and sitz baths to help promote healing. She is  strongly encouraged to follow with her primary GYN provider today for definitive treatment. She [facility] otherwise [facility] and [facility]. Return precautions are given reviewed.  Clinical Course   ____________________________________________  FINAL CLINICAL IMPRESSION(S) / ED DIAGNOSES  Final diagnoses:  Bartholin gland cyst      Lissa HoardJenise V Bacon Ellean Firman, PA-C 03/04/16 1118    Governor Rooksebecca Lord, MD 03/04/16 1123

## 2016-03-04 NOTE — ED Triage Notes (Signed)
Pt to ed with c/o abscess in pelvic area.  Pt states she was seen here for same on 10/18.  reports area is increasing in size.

## 2018-03-29 IMAGING — CR DG CHEST 2V
2 series · 2 of 2 positions shown · non-contrast
Comparison: None.

CLINICAL DATA: Chest pain and chest tightness

EXAM:
CHEST  2 VIEW

[chest pa]
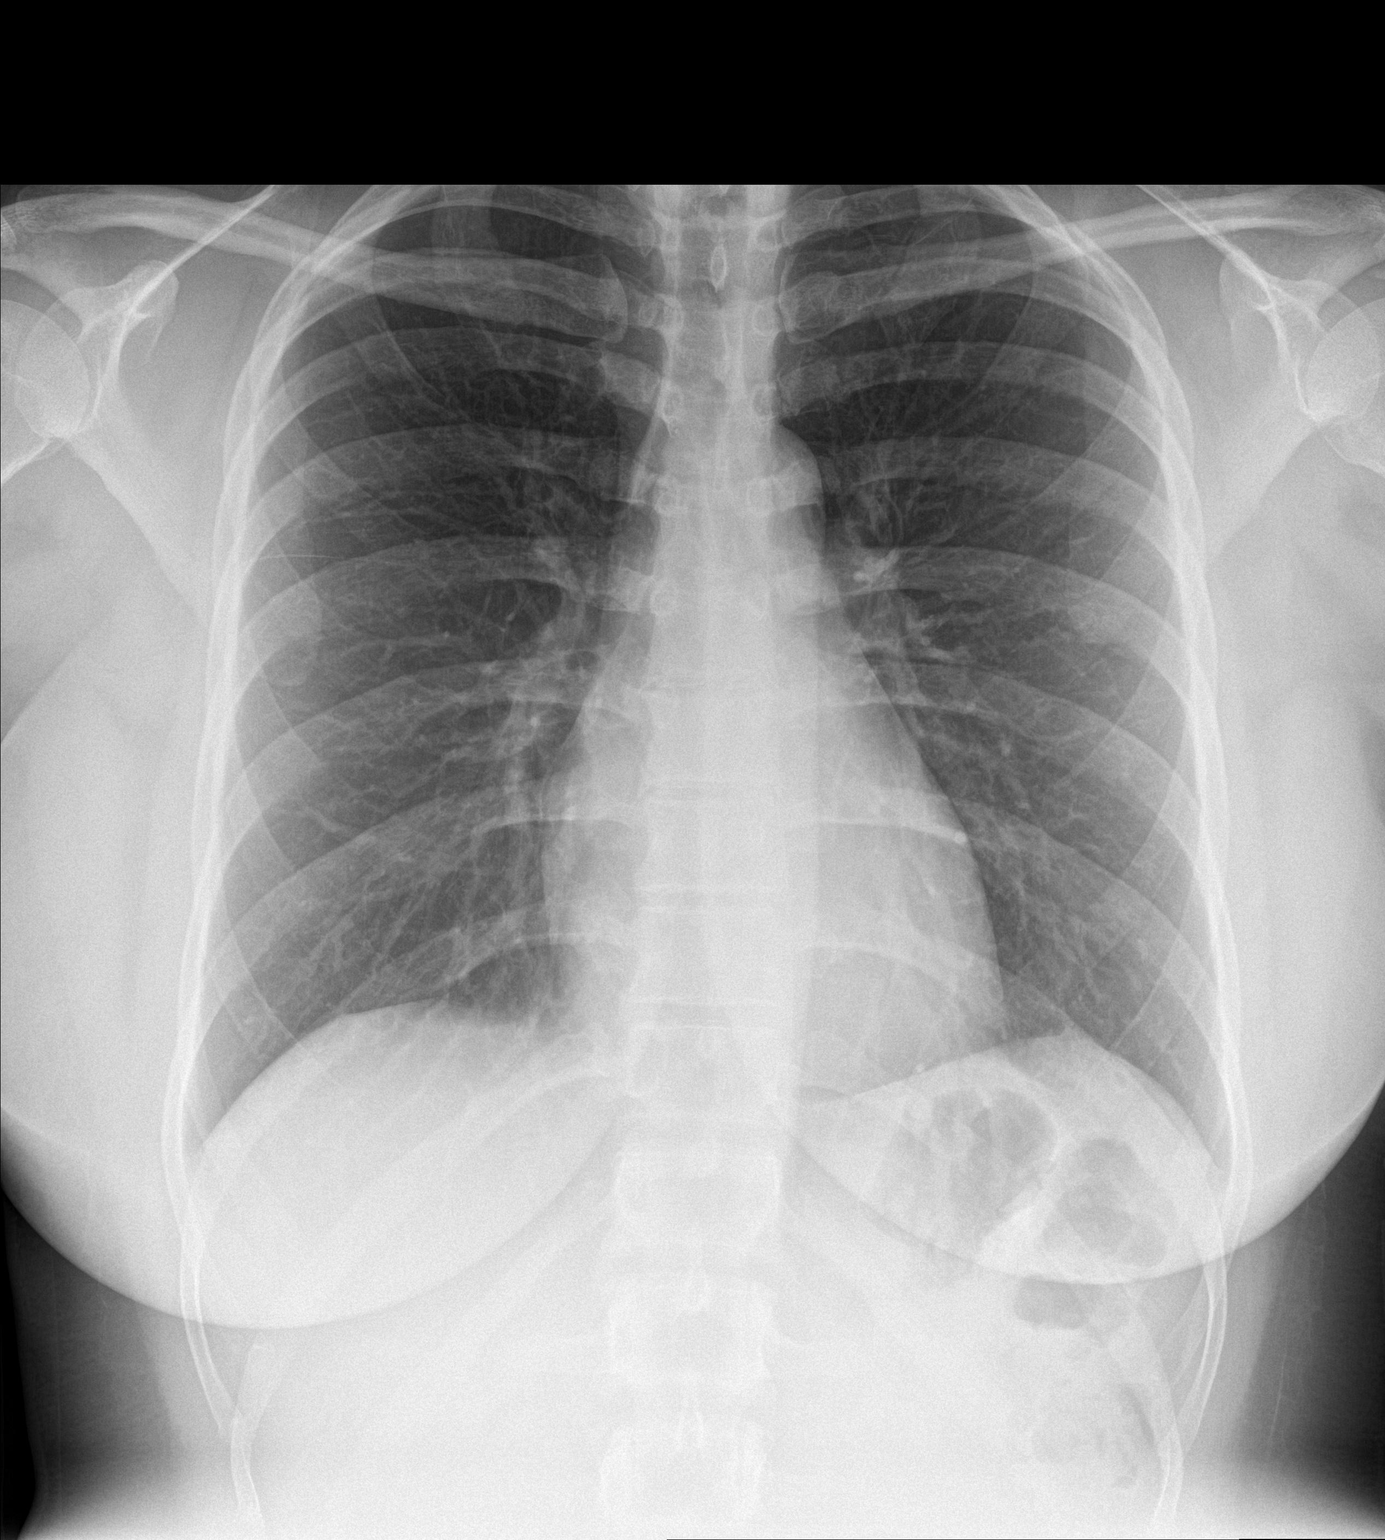

[chest lat]
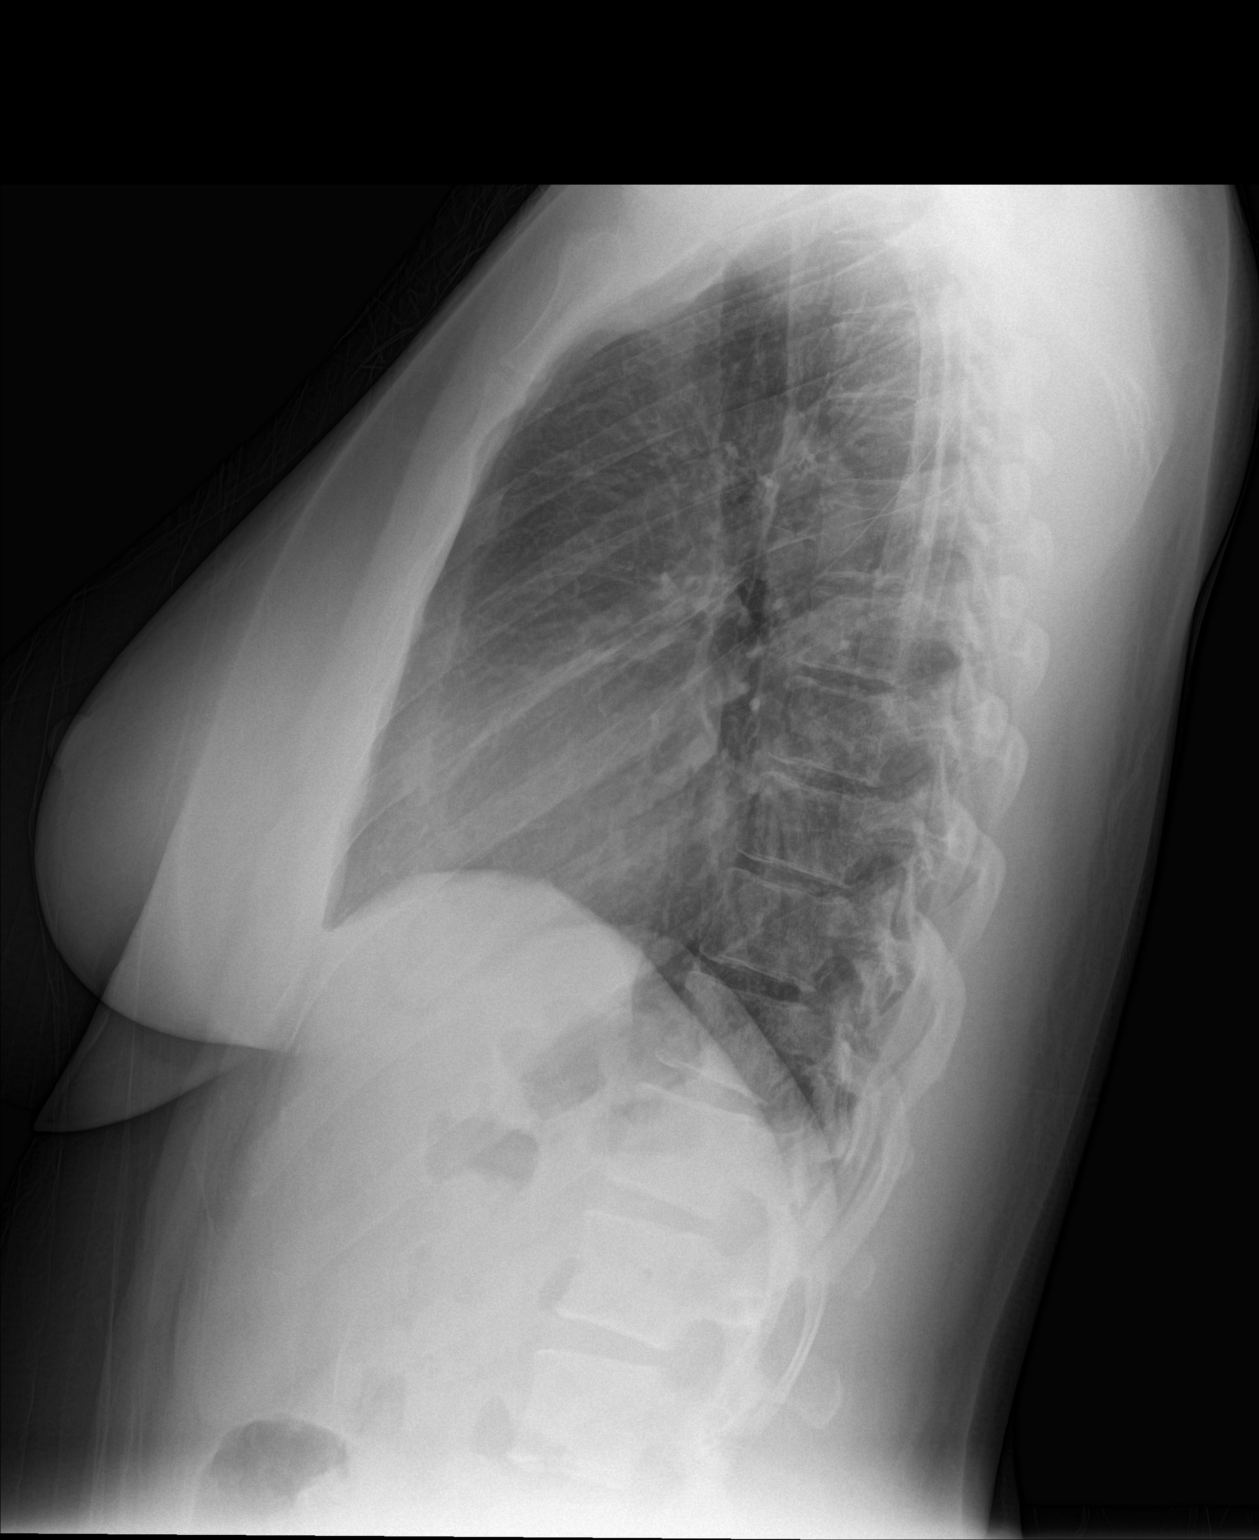

[2 of 2 positions shown; findings below may reference images not displayed]

FINDINGS: The heart size and mediastinal contours are within normal limits.
Both lungs are clear. The visualized skeletal structures are
unremarkable.
IMPRESSION: No active cardiopulmonary disease.

## 2018-05-05 IMAGING — CT CT PELVIS W/ CM
2 of 3 series · 17 of 46 positions shown, 19 images · IV contrast (APPLIED)
Comparison: None.

CLINICAL DATA: Painful enlarging bump posteriorly.

EXAM:
CT PELVIS WITH CONTRAST
TECHNIQUE: Multidetector CT imaging of the pelvis was performed using the
standard protocol following the bolus administration of intravenous
contrast.
CONTRAST:  100mL GVP11V-622 IOPAMIDOL (GVP11V-622) INJECTION 61%

[Series 2: axial st · axial · 0.87mm/px · z∈[-456,-221]mm · 14 of 55 slices shown, 16 images]
[im 4/55  soft-tissue]
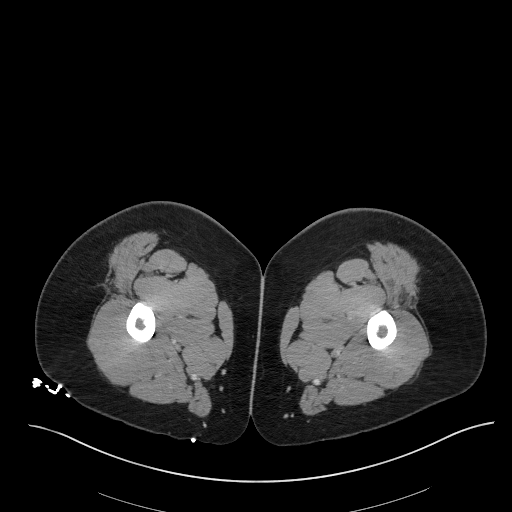
[im 4/55  bone]
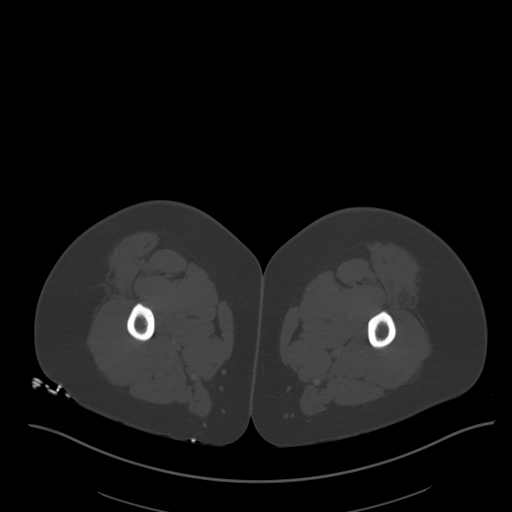
[im 7/55  soft-tissue]
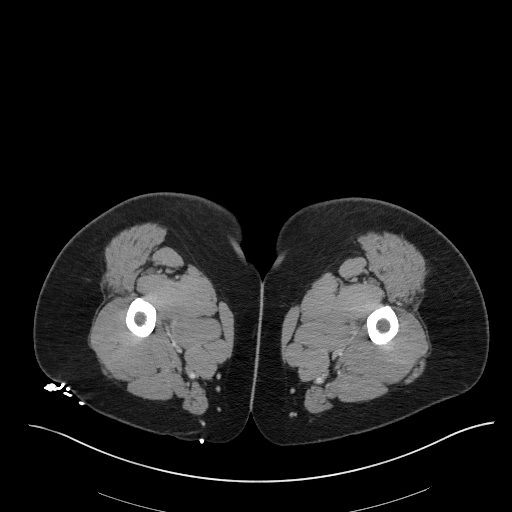
[im 11/55  soft-tissue]
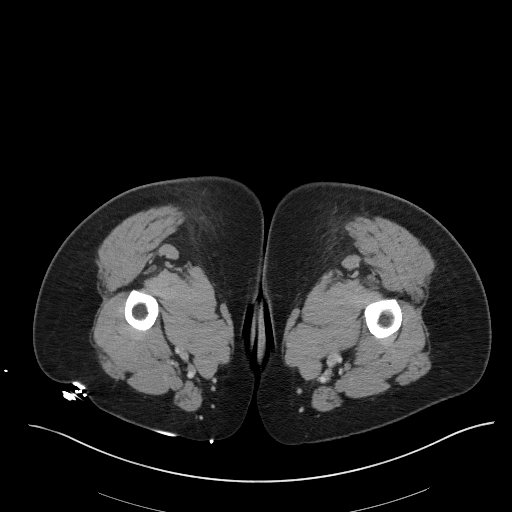
[im 14/55  soft-tissue]
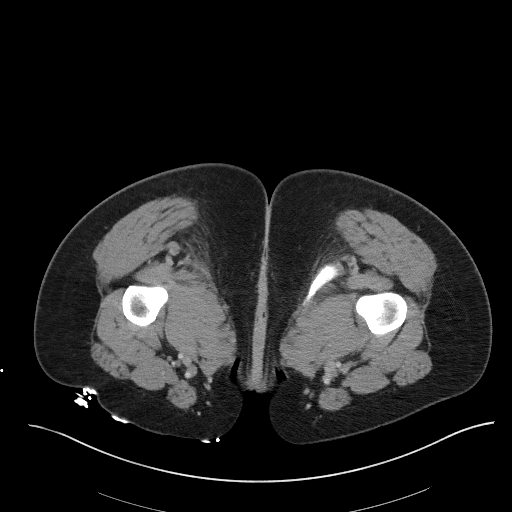
[im 18/55  soft-tissue]
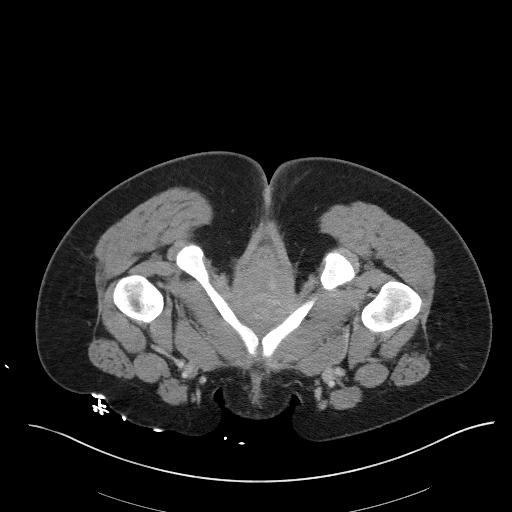
[im 21/55  soft-tissue]
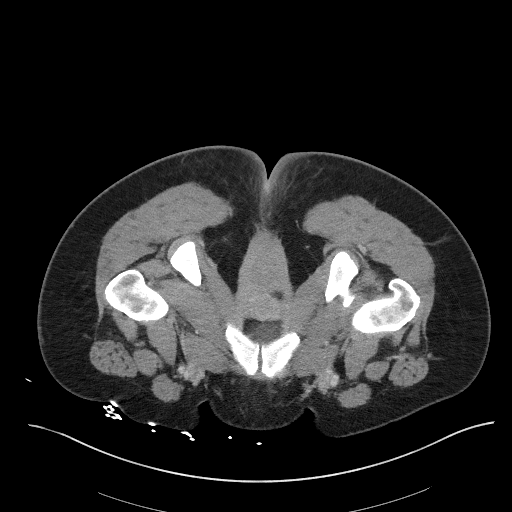
[im 25/55  soft-tissue]
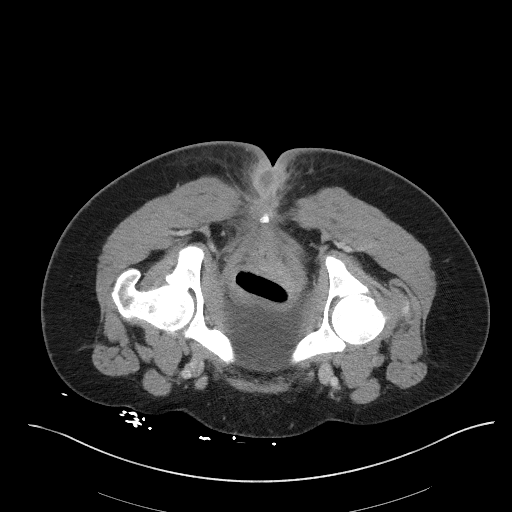
[im 30/55  soft-tissue]
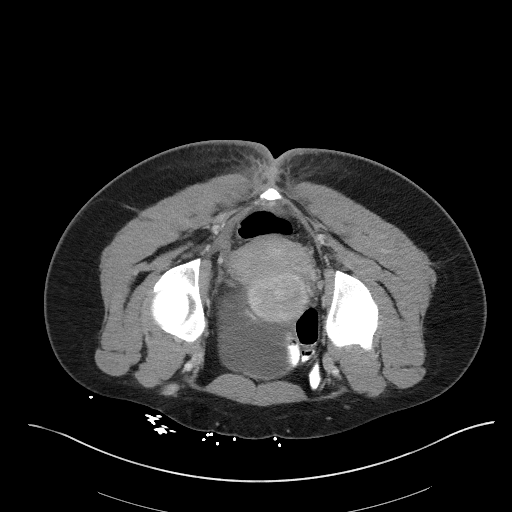
[im 34/55  soft-tissue]
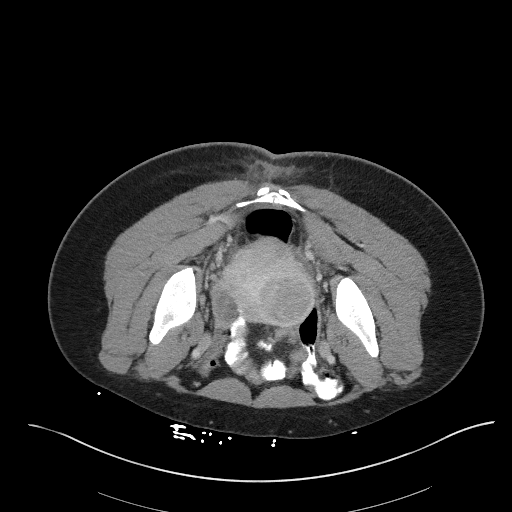
[im 34/55  bone]
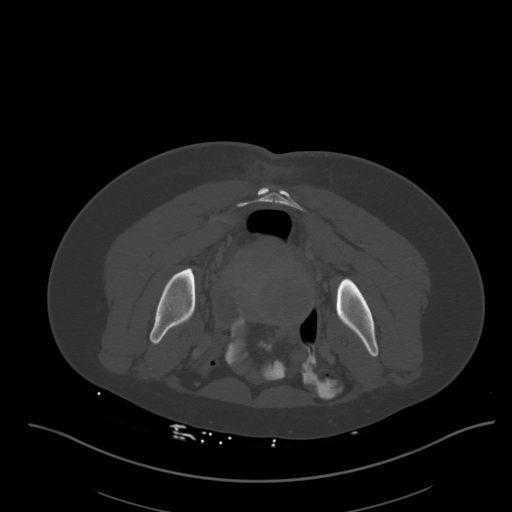
[im 37/55  soft-tissue]
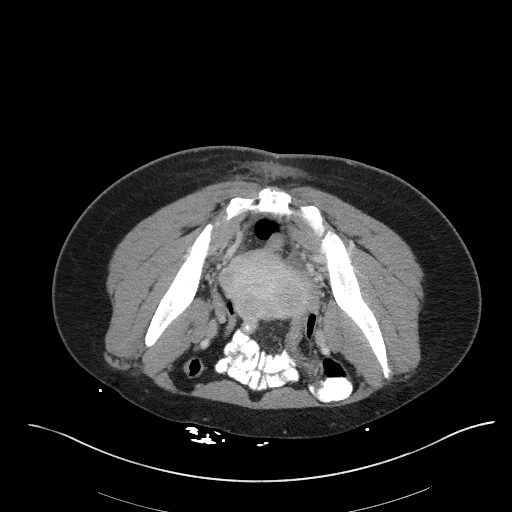
[im 41/55  soft-tissue]
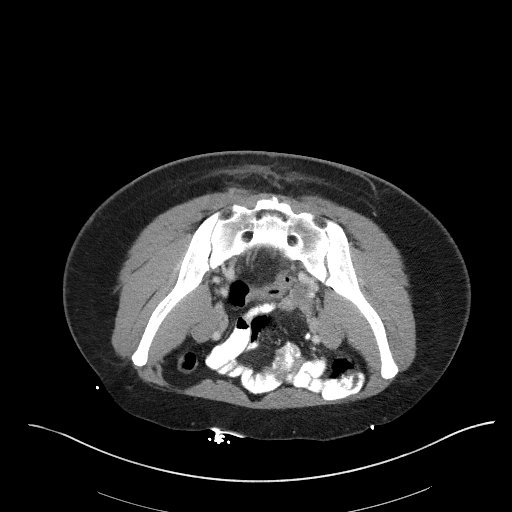
[im 44/55  soft-tissue]
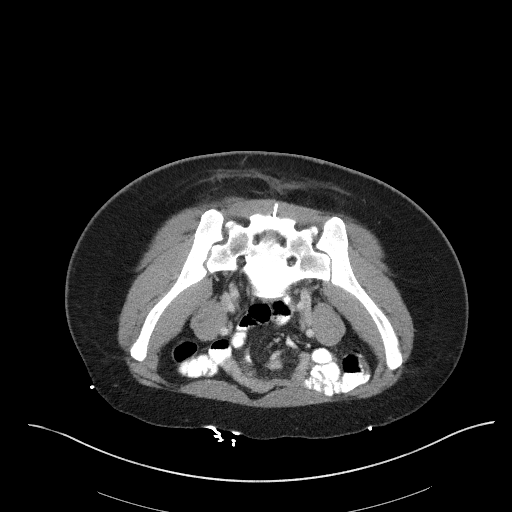
[im 48/55  soft-tissue]
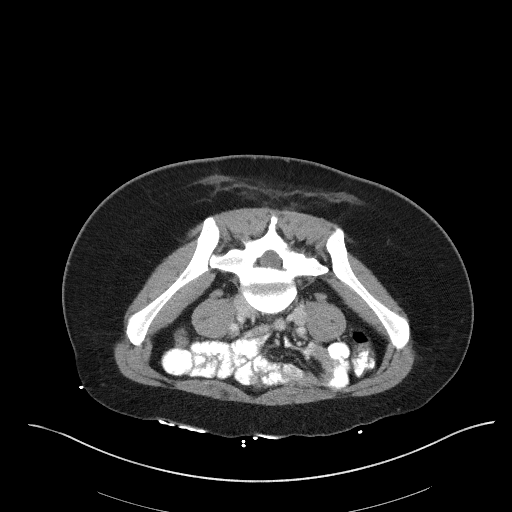
[im 51/55  soft-tissue]
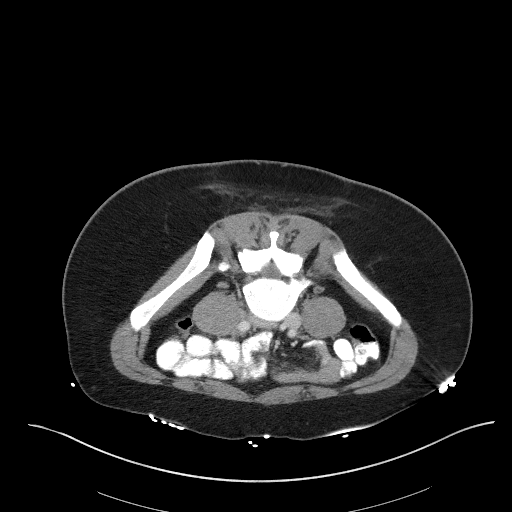

[Series 4: coronal st · coronal · 0.54mm/px · 3 of 88 slices shown]
[im 30/88  soft-tissue]
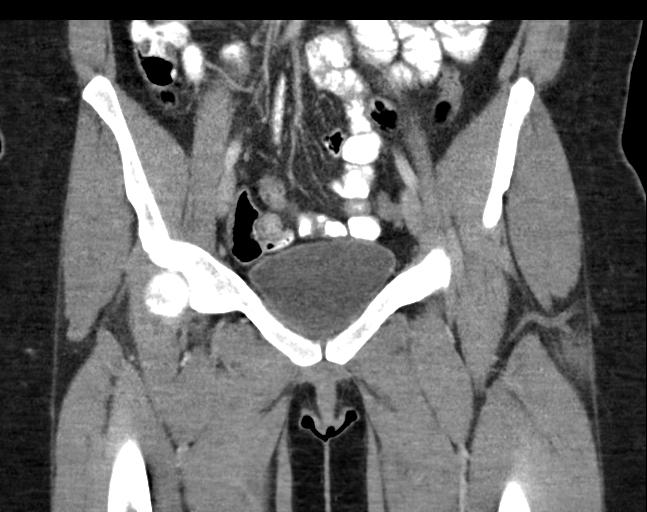
[im 39/88  soft-tissue]
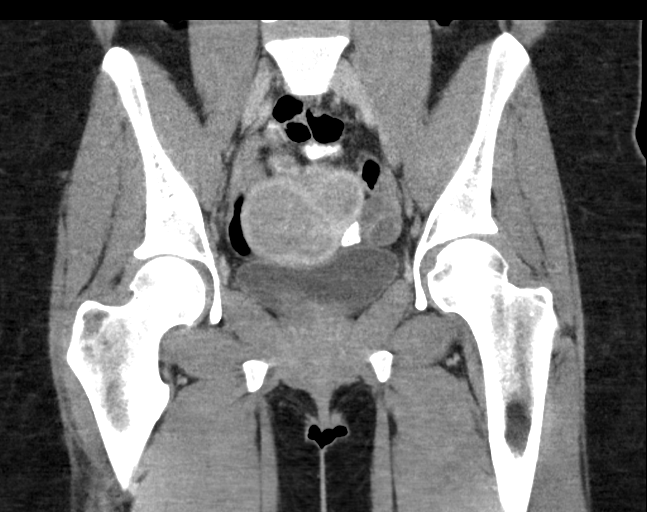
[im 49/88  soft-tissue]
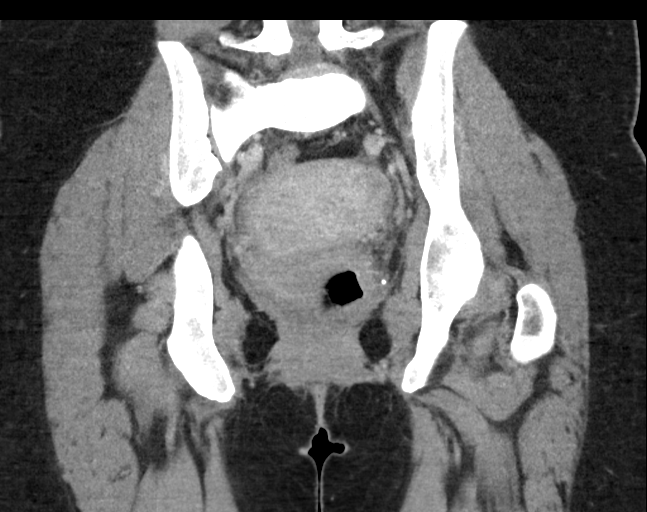

[17 of 46 positions shown; findings below may reference images not displayed]

FINDINGS: There is a 2 x 2.4 cm superficial soft tissue abscess in the midline
overlying the coccyx. No sign of radiopaque foreign object. No sign
of osteomyelitis. No sign of deep pelvic extension.

Patient has multiple leiomyomas of the uterus incidentally noted. No
evidence of internal pelvic inflammatory disease.
IMPRESSION: 2 x 2.4 cm abscess within the superficial subcutaneous tissues
overlying the level of the coccyx.
